# Patient Record
Sex: Male | Born: 1945 | Race: White | Hispanic: No | Marital: Single | Smoking: Former smoker
Health system: Southern US, Community
[De-identification: ages and names within clinical notes are randomized; demographics above are authoritative.]

## PROBLEM LIST (undated history)

## (undated) DIAGNOSIS — G4730 Sleep apnea, unspecified: Secondary | ICD-10-CM

## (undated) DIAGNOSIS — E119 Type 2 diabetes mellitus without complications: Secondary | ICD-10-CM

## (undated) DIAGNOSIS — J449 Chronic obstructive pulmonary disease, unspecified: Secondary | ICD-10-CM

## (undated) DIAGNOSIS — N200 Calculus of kidney: Secondary | ICD-10-CM

---

## 2018-03-10 ENCOUNTER — Ambulatory Visit: Payer: Self-pay | Admitting: Physician Assistant

## 2018-08-23 ENCOUNTER — Ambulatory Visit: Payer: Medicare Other | Admitting: Family Medicine

## 2020-02-01 DIAGNOSIS — Z23 Encounter for immunization: Secondary | ICD-10-CM | POA: Diagnosis not present

## 2020-04-08 DIAGNOSIS — N184 Chronic kidney disease, stage 4 (severe): Secondary | ICD-10-CM | POA: Diagnosis not present

## 2020-04-08 DIAGNOSIS — N1832 Chronic kidney disease, stage 3b: Secondary | ICD-10-CM | POA: Diagnosis not present

## 2020-09-30 ENCOUNTER — Emergency Department: Payer: No Typology Code available for payment source

## 2020-09-30 ENCOUNTER — Inpatient Hospital Stay
Admission: EM | Admit: 2020-09-30 | Discharge: 2020-09-30 | DRG: 293 | Disposition: A | Discharge status: Home / Self Care | Payer: No Typology Code available for payment source | Attending: Internal Medicine | Admitting: Internal Medicine

## 2020-09-30 ENCOUNTER — Other Ambulatory Visit: Payer: Self-pay

## 2020-09-30 DIAGNOSIS — I4891 Unspecified atrial fibrillation: Secondary | ICD-10-CM | POA: Diagnosis present

## 2020-09-30 DIAGNOSIS — R0902 Hypoxemia: Secondary | ICD-10-CM

## 2020-09-30 DIAGNOSIS — I110 Hypertensive heart disease with heart failure: Secondary | ICD-10-CM | POA: Diagnosis not present

## 2020-09-30 DIAGNOSIS — G4730 Sleep apnea, unspecified: Secondary | ICD-10-CM | POA: Diagnosis present

## 2020-09-30 DIAGNOSIS — Z79899 Other long term (current) drug therapy: Secondary | ICD-10-CM

## 2020-09-30 DIAGNOSIS — I509 Heart failure, unspecified: Secondary | ICD-10-CM | POA: Diagnosis present

## 2020-09-30 DIAGNOSIS — Z20822 Contact with and (suspected) exposure to covid-19: Secondary | ICD-10-CM | POA: Diagnosis present

## 2020-09-30 DIAGNOSIS — J449 Chronic obstructive pulmonary disease, unspecified: Secondary | ICD-10-CM | POA: Diagnosis present

## 2020-09-30 DIAGNOSIS — Z743 Need for continuous supervision: Secondary | ICD-10-CM | POA: Diagnosis not present

## 2020-09-30 DIAGNOSIS — R609 Edema, unspecified: Secondary | ICD-10-CM | POA: Diagnosis not present

## 2020-09-30 DIAGNOSIS — R0602 Shortness of breath: Secondary | ICD-10-CM | POA: Diagnosis not present

## 2020-09-30 DIAGNOSIS — I10 Essential (primary) hypertension: Secondary | ICD-10-CM

## 2020-09-30 DIAGNOSIS — R0689 Other abnormalities of breathing: Secondary | ICD-10-CM | POA: Diagnosis not present

## 2020-09-30 DIAGNOSIS — R6889 Other general symptoms and signs: Secondary | ICD-10-CM | POA: Diagnosis not present

## 2020-09-30 DIAGNOSIS — E119 Type 2 diabetes mellitus without complications: Secondary | ICD-10-CM | POA: Diagnosis present

## 2020-09-30 HISTORY — DX: Chronic obstructive pulmonary disease, unspecified: J44.9

## 2020-09-30 HISTORY — DX: Calculus of kidney: N20.0

## 2020-09-30 HISTORY — DX: Type 2 diabetes mellitus without complications: E11.9

## 2020-09-30 HISTORY — DX: Sleep apnea, unspecified: G47.30

## 2020-09-30 LAB — COMPREHENSIVE METABOLIC PANEL
ALT: 29 U/L (ref 0–44)
AST: 28 U/L (ref 15–41)
Albumin: 3.7 g/dL (ref 3.5–5.0)
Alkaline Phosphatase: 68 U/L (ref 38–126)
Anion gap: 11 (ref 5–15)
BUN: 38 mg/dL — ABNORMAL HIGH (ref 8–23)
CO2: 22 mmol/L (ref 22–32)
Calcium: 9 mg/dL (ref 8.9–10.3)
Chloride: 105 mmol/L (ref 98–111)
Creatinine, Ser: 3.43 mg/dL — ABNORMAL HIGH (ref 0.61–1.24)
GFR, Estimated: 18 mL/min — ABNORMAL LOW (ref >60)
Glucose, Bld: 134 mg/dL — ABNORMAL HIGH (ref 70–99)
Potassium: 3.4 mmol/L — ABNORMAL LOW (ref 3.5–5.1)
Sodium: 138 mmol/L (ref 135–145)
Total Bilirubin: 0.8 mg/dL (ref 0.3–1.2)
Total Protein: 7 g/dL (ref 6.5–8.1)

## 2020-09-30 LAB — CBC WITH DIFFERENTIAL/PLATELET
Abs Immature Granulocytes: 0.04 10*3/uL (ref 0.00–0.07)
Basophils Absolute: 0 10*3/uL (ref 0.0–0.1)
Basophils Relative: 0 %
Eosinophils Absolute: 0.5 10*3/uL (ref 0.0–0.5)
Eosinophils Relative: 6 %
HCT: 35.7 % — ABNORMAL LOW (ref 39.0–52.0)
Hemoglobin: 11.8 g/dL — ABNORMAL LOW (ref 13.0–17.0)
Immature Granulocytes: 1 %
Lymphocytes Relative: 19 %
Lymphs Abs: 1.7 10*3/uL (ref 0.7–4.0)
MCH: 27.8 pg (ref 26.0–34.0)
MCHC: 33.1 g/dL (ref 30.0–36.0)
MCV: 84.2 fL (ref 80.0–100.0)
Monocytes Absolute: 0.6 10*3/uL (ref 0.1–1.0)
Monocytes Relative: 7 %
Neutro Abs: 5.8 10*3/uL (ref 1.7–7.7)
Neutrophils Relative %: 67 %
Platelets: 234 10*3/uL (ref 150–400)
RBC: 4.24 MIL/uL (ref 4.22–5.81)
RDW: 15.1 % (ref 11.5–15.5)
WBC: 8.6 10*3/uL (ref 4.0–10.5)
nRBC: 0 % (ref 0.0–0.2)

## 2020-09-30 LAB — TROPONIN I (HIGH SENSITIVITY)
Troponin I (High Sensitivity): 31 ng/L — ABNORMAL HIGH (ref <18)
Troponin I (High Sensitivity): 50 ng/L — ABNORMAL HIGH (ref <18)

## 2020-09-30 LAB — PROTIME-INR
INR: 1.2 (ref 0.8–1.2)
Prothrombin Time: 14.9 seconds (ref 11.4–15.2)

## 2020-09-30 LAB — RESP PANEL BY RT-PCR (FLU A&B, COVID) ARPGX2
Influenza A by PCR: NEGATIVE
Influenza B by PCR: NEGATIVE
SARS Coronavirus 2 by RT PCR: NEGATIVE

## 2020-09-30 LAB — BRAIN NATRIURETIC PEPTIDE: B Natriuretic Peptide: 1119.7 pg/mL — ABNORMAL HIGH (ref 0.0–100.0)

## 2020-09-30 MED ORDER — ONDANSETRON HCL 4 MG/2ML IJ SOLN
4.0000 mg | Freq: Once | INTRAMUSCULAR | Status: AC
  Administered 2020-09-30: 4 mg via INTRAVENOUS
  Filled 2020-09-30: qty 2

## 2020-09-30 MED ORDER — MOMETASONE FURO-FORMOTEROL FUM 200-5 MCG/ACT IN AERO
2.0000 | INHALATION_SPRAY | Freq: Two times a day (BID) | RESPIRATORY_TRACT | Status: DC
  Administered 2020-09-30: 2 via RESPIRATORY_TRACT
  Filled 2020-09-30: qty 8.8

## 2020-09-30 MED ORDER — IRBESARTAN 150 MG PO TABS
300.0000 mg | ORAL_TABLET | Freq: Every day | ORAL | Status: DC
  Administered 2020-09-30: 300 mg via ORAL
  Filled 2020-09-30: qty 2

## 2020-09-30 MED ORDER — LORATADINE 10 MG PO TABS: 10.0000 mg | ORAL_TABLET | Freq: Every day | ORAL | Status: DC

## 2020-09-30 MED ORDER — ALLOPURINOL 100 MG PO TABS
100.0000 mg | ORAL_TABLET | Freq: Every day | ORAL | Status: DC
  Administered 2020-09-30: 100 mg via ORAL
  Filled 2020-09-30: qty 1

## 2020-09-30 MED ORDER — SODIUM CHLORIDE 0.9% FLUSH
3.0000 mL | Freq: Two times a day (BID) | INTRAVENOUS | Status: DC
  Administered 2020-09-30: 3 mL via INTRAVENOUS

## 2020-09-30 MED ORDER — TERBINAFINE HCL 1 % EX CREA: 1.0000 "application " | TOPICAL_CREAM | Freq: Two times a day (BID) | CUTANEOUS | Status: DC

## 2020-09-30 MED ORDER — SODIUM CHLORIDE 0.9% FLUSH: 3.0000 mL | INTRAVENOUS | Status: DC | PRN

## 2020-09-30 MED ORDER — FLUTICASONE PROPIONATE 50 MCG/ACT NA SUSP
1.0000 | Freq: Every day | NASAL | Status: DC
  Filled 2020-09-30: qty 16

## 2020-09-30 MED ORDER — TRETINOIN 0.025 % EX CREA: TOPICAL_CREAM | Freq: Every day | CUTANEOUS | Status: DC

## 2020-09-30 MED ORDER — ACETAMINOPHEN 500 MG PO TABS
ORAL_TABLET | ORAL | Status: AC
  Filled 2020-09-30: qty 1

## 2020-09-30 MED ORDER — ONDANSETRON HCL 4 MG/2ML IJ SOLN: 4.0000 mg | Freq: Four times a day (QID) | INTRAMUSCULAR | Status: DC | PRN

## 2020-09-30 MED ORDER — CALCITRIOL 0.25 MCG PO CAPS
0.2500 ug | ORAL_CAPSULE | Freq: Every day | ORAL | Status: DC
  Administered 2020-09-30: 0.25 ug via ORAL
  Filled 2020-09-30: qty 1

## 2020-09-30 MED ORDER — NITROGLYCERIN 2 % TD OINT
1.0000 [in_us] | TOPICAL_OINTMENT | Freq: Once | TRANSDERMAL | Status: AC
  Administered 2020-09-30: 1 [in_us] via TOPICAL
  Filled 2020-09-30: qty 1

## 2020-09-30 MED ORDER — POLYVINYL ALCOHOL 1.4 % OP SOLN
1.0000 [drp] | Freq: Three times a day (TID) | OPHTHALMIC | Status: DC | PRN
  Filled 2020-09-30: qty 15

## 2020-09-30 MED ORDER — TRIAMCINOLONE ACETONIDE 0.1 % EX CREA: 1.0000 "application " | TOPICAL_CREAM | Freq: Two times a day (BID) | CUTANEOUS | Status: DC

## 2020-09-30 MED ORDER — SPIRONOLACTONE 25 MG PO TABS
25.0000 mg | ORAL_TABLET | Freq: Every day | ORAL | Status: DC
  Administered 2020-09-30: 25 mg via ORAL
  Filled 2020-09-30: qty 1

## 2020-09-30 MED ORDER — FUROSEMIDE 10 MG/ML IJ SOLN: 60.0000 mg | Freq: Two times a day (BID) | INTRAMUSCULAR | Status: DC

## 2020-09-30 MED ORDER — ACETAMINOPHEN 325 MG PO TABS: 650.0000 mg | ORAL_TABLET | ORAL | Status: DC | PRN

## 2020-09-30 MED ORDER — HYDRALAZINE HCL 50 MG PO TABS
50.0000 mg | ORAL_TABLET | Freq: Three times a day (TID) | ORAL | Status: DC
  Administered 2020-09-30: 50 mg via ORAL
  Filled 2020-09-30: qty 1

## 2020-09-30 MED ORDER — FUROSEMIDE 10 MG/ML IJ SOLN
40.0000 mg | Freq: Once | INTRAMUSCULAR | Status: AC
  Administered 2020-09-30: 40 mg via INTRAVENOUS
  Filled 2020-09-30: qty 4

## 2020-09-30 MED ORDER — MELATONIN 5 MG PO TABS: 5.0000 mg | ORAL_TABLET | Freq: Every day | ORAL | Status: DC

## 2020-09-30 MED ORDER — DARIFENACIN HYDROBROMIDE ER 15 MG PO TB24: 15.0000 mg | ORAL_TABLET | Freq: Every day | ORAL | Status: DC

## 2020-09-30 MED ORDER — SODIUM CHLORIDE 0.9 % IV SOLN: 250.0000 mL | INTRAVENOUS | Status: DC | PRN

## 2020-09-30 MED ORDER — ENOXAPARIN SODIUM 30 MG/0.3ML IJ SOSY
30.0000 mg | PREFILLED_SYRINGE | INTRAMUSCULAR | Status: DC
  Filled 2020-09-30: qty 0.3

## 2020-09-30 MED ORDER — ACETAMINOPHEN 500 MG PO TABS
1000.0000 mg | ORAL_TABLET | Freq: Once | ORAL | Status: AC
  Administered 2020-09-30: 1000 mg via ORAL

## 2020-09-30 MED ORDER — CARVEDILOL 6.25 MG PO TABS: 12.5000 mg | ORAL_TABLET | Freq: Two times a day (BID) | ORAL | Status: DC

## 2020-09-30 MED ORDER — POTASSIUM CHLORIDE CRYS ER 20 MEQ PO TBCR
20.0000 meq | EXTENDED_RELEASE_TABLET | Freq: Two times a day (BID) | ORAL | Status: DC
  Administered 2020-09-30: 20 meq via ORAL

## 2020-09-30 NOTE — ED Notes (Signed)
Report given to Katie RN.

## 2020-09-30 NOTE — ED Provider Notes (Addendum)
Procedures  Clinical Course as of 09/30/20 1506  Mon Sep 30, 2020  [id number] Lab work noted; [place] transfer forms filled out and will be faxed.  Care will be transferred to Dr. Joni Fears at change of shift pending acceptance to the [place].  [facility] will [facility] patient to our facility for CHF exacerbation with hypoxia. [JS]  [id number] [facility] Kenton Kingfisher who notes that they lack any bed availability for telemetry or stepdown at this time, they are unable to accept and they declined the transfer.  They recommend local admission at [facility] for further management.  I will discuss with hospitalist. [PS]    Clinical Course User Index [JS] Paulette Blanch, MD [PS] Carrie Mew, MD   Patient now declines to be admitted.  Refused hospitalization stating that he feels better.  He is lying supine on the bed, oxygen saturation 96% on room air.  Denies orthopnea.  He has had good urine output here in the ED.  He notes that he has an appointment with his [place] cardiologist in 3 days on Thursday.  Will have the patient ambulate in the room to ensure that he does not become severely symptomatic.  I will have him take an increased dose of his morning Lasix for the next 2 days, and continue his usual dose of afternoon Lasix.  We will provide information for [facility] heart failure clinic as needed.  Advised to return if symptoms worsen.  Will discharge Erik Clark.  Final diagnoses:  Acute on chronic congestive heart failure, unspecified heart failure type ([place])  Hypoxia  Hypertension, unspecified type  Atrial fibrillation, unspecified type [facility])      Carrie Mew, MD 09/30/20 1417    Carrie Mew, MD 09/30/20 1506

## 2020-09-30 NOTE — ED Notes (Signed)
Pt SpO2 89% on RA. Pt placed on 2L [place]

## 2020-09-30 NOTE — Progress Notes (Signed)
Patient declines admission at this time.  Notified Dr Joni Fears

## 2020-09-30 NOTE — Discharge Instructions (Signed)
For the next two days, increase your morning dose of Lasix to '80mg'$ .  Continue your '40mg'$  lasix dose for the afternoon dose.  After 2 days, resume taking '40mg'$  lasix twice a day.

## 2020-09-30 NOTE — ED Notes (Signed)
Faxed Paperwork to [place] in [place]

## 2020-09-30 NOTE — ED Provider Notes (Addendum)
[facility] Emergency Department Provider Note   ____________________________________________   Event Date/Time   First MD Initiated Contact with Patient 09/30/20 0532     (approximate)  I have reviewed the triage vital signs and the nursing notes.   HISTORY  Chief Complaint Shortness of Breath (Pt presenting from home via EMS. Reports SOB that started yesterday and worsened this AM while pt was lying down trying to go to bed. Denies chest pain. Reports feeling palpitations. BP medications recently changed, but reports taking meds as prescribed. Received 1 duoneb en route that he states helped some )    HPI Erik Clark is a 75 y.o. male brought to the ED via EMS from home with a chief complaint of shortness of breath.  Patient reports shortness of breath while trying to lay down to go to sleep.  Admits to eating a bag of potato chips last evening.  History of CHF not on home oxygen, PAF on Eliquis, COPD.  Received DuoNeb by EMS prior to arrival with some improvement in symptoms.  Denies fever, cough, chest pain, abdominal pain, nausea, vomiting or dizziness.     Past Medical History:  Diagnosis Date  . COPD (chronic obstructive pulmonary disease) ([place])   . Diabetes mellitus without complication ([place])   . Kidney stone   . Sleep apnea     There are no problems to display for this patient.   History reviewed. No pertinent surgical history.  Prior to Admission medications   Medication Sig Start Date End Date Taking? Authorizing Provider  allopurinol (ZYLOPRIM) 100 MG tablet Take 100 mg by mouth daily.   Yes [provider]  calcitRIOL (ROCALTROL) 0.25 MCG capsule Take 0.25 mcg by mouth daily.   Yes [provider]  carboxymethylcellulose (REFRESH PLUS) 0.5 % SOLN 1 drop in the morning, at noon, in the evening, and at bedtime.   Yes [provider]  carvedilol (COREG) 12.5 MG tablet Take 12.5 mg by mouth 2 (two) times  daily with a meal.   Yes [provider]  desonide (DESOWEN) 0.05 % cream Apply topically 2 (two) times daily. Apply to itchy areas of face and chest   Yes [provider]  fluocinonide (LIDEX) 0.05 % external solution Apply 1 application topically 2 (two) times daily. Apply to scalp   Yes [provider]  fluticasone (FLONASE) 50 MCG/ACT nasal spray Place 1 spray into both nostrils daily.   Yes [provider]  fluticasone-salmeterol (ADVAIR) 250-50 MCG/ACT AEPB Inhale 1 puff into the lungs in the morning and at bedtime.   Yes [provider]  furosemide (LASIX) 40 MG tablet Take 40 mg by mouth 2 (two) times daily.   Yes [provider]  hydrALAZINE (APRESOLINE) 50 MG tablet Take 50 mg by mouth 3 (three) times daily.   Yes [provider]  ketoconazole (NIZORAL) 2 % cream Apply 1 application topically 2 (two) times daily. Apply to chest area   Yes [provider]  ketoconazole (NIZORAL) 2 % shampoo Apply 1 application topically 2 (two) times a week.   Yes [provider]  ketotifen (ZADITOR) 0.025 % ophthalmic solution Place 1 drop into both eyes 2 (two) times daily.   Yes [provider]  loratadine (CLARITIN) 10 MG tablet Take 10 mg by mouth daily.   Yes [provider]  potassium chloride SA (KLOR-CON) 20 MEQ tablet Take 20 mEq by mouth 2 (two) times daily.   Yes [provider]  sildenafil (VIAGRA) 100 MG tablet Take 100 mg by mouth daily as needed for erectile dysfunction.   Yes [provider]  solifenacin (VESICARE) 10 MG tablet Take 10 mg by mouth daily.   Yes [provider]  terbinafine (LAMISIL) 1 % cream Apply 1 application topically 2 (two) times daily.   Yes [provider]  tretinoin (RETIN-A) 0.025 % cream Apply topically at bedtime.   Yes [provider]  triamcinolone cream (KENALOG) 0.1 % Apply 1 application topically 2 (two) times daily.    Yes [provider]  valsartan (DIOVAN) 320 MG tablet Take 320 mg by mouth daily.   Yes [provider]    Allergies Latex  History reviewed. No pertinent family history.  Social History Social History   Tobacco Use  . Smoking status: Former Research scientist (life sciences)  . Smokeless tobacco: Never Used  Substance Use Topics  . Alcohol use: Not Currently  . Drug use: Never    Review of Systems  Constitutional: No fever/chills Eyes: No visual changes. ENT: No sore throat. Cardiovascular: Denies chest pain. Respiratory: Positive for shortness of breath. Gastrointestinal: No abdominal pain.  No nausea, no vomiting.  No diarrhea.  No constipation. Genitourinary: Negative for dysuria. Musculoskeletal: Negative for back pain. Skin: Negative for rash. Neurological: Negative for headaches, focal weakness or numbness.   ____________________________________________   PHYSICAL EXAM:  VITAL SIGNS: ED Triage Vitals  Enc Vitals Group     BP 09/30/20 0525 (!) 215/113     Pulse Rate 09/30/20 0525 76     Resp 09/30/20 0525 (!) 23     Temp 09/30/20 0531 98 F (36.7 C)     Temp Source 09/30/20 0525 Oral     SpO2 09/30/20 0521 (!) 89 %     Weight 09/30/20 0524 250 lb (113.4 kg)     Height 09/30/20 0524 6' (1.829 m)     Head Circumference --      Peak Flow --      Pain Score 09/30/20 0525 0     Pain Loc --      Pain Edu? --      Excl. in [place]? --     Constitutional: Alert and oriented.  Elderly appearing and in mild acute distress. Eyes: Conjunctivae are normal. PERRL. EOMI. Head: Atraumatic. Nose: No congestion/rhinnorhea. Mouth/Throat: Mucous membranes are moist.   Neck: No stridor.   Cardiovascular: Normal rate, regular rhythm. Grossly normal heart sounds.  Good peripheral circulation. Respiratory: Increased respiratory effort.  No retractions. Lungs with bibasilar rales. Gastrointestinal: Soft and nontender to light or deep palpation. No distention. No abdominal bruits. No  CVA tenderness. Musculoskeletal: No lower extremity tenderness.  1+ BLE nonpitting edema.  No joint effusions. Neurologic:  Normal speech and language. No gross focal neurologic deficits are appreciated. No gait instability. Skin:  Skin is warm, dry and intact. No rash noted. Psychiatric: Mood and affect are normal. Speech and behavior are normal.  ____________________________________________   LABS (all labs ordered are listed, but only abnormal results are displayed)  Labs Reviewed  CBC WITH DIFFERENTIAL/PLATELET - Abnormal; Notable for the following components:      Result Value   Hemoglobin 11.8 (*)    HCT 35.7 (*)    All other components within normal limits  COMPREHENSIVE METABOLIC PANEL - Abnormal; Notable for the following components:   Potassium 3.4 (*)    Glucose, Bld 134 (*)    BUN 38 (*)    Creatinine, Ser 3.43 (*)  GFR, Estimated 18 (*)    All other components within normal limits  BRAIN NATRIURETIC PEPTIDE - Abnormal; Notable for the following components:   B Natriuretic Peptide 1,119.7 (*)    All other components within normal limits  TROPONIN I (HIGH SENSITIVITY) - Abnormal; Notable for the following components:   Troponin I (High Sensitivity) 31 (*)    All other components within normal limits  RESP PANEL BY RT-PCR (FLU A&B, COVID) ARPGX2  PROTIME-INR  TROPONIN I (HIGH SENSITIVITY)   ____________________________________________  EKG  ED ECG REPORT I, Farhaan Mabee J, the attending physician, personally viewed and interpreted this ECG.   Date: 09/30/2020  EKG Time: 0540  Rate: 74  Rhythm: normal EKG, normal sinus rhythm  Axis: Normal  Intervals:nonspecific intraventricular conduction delay  ST&T Change: Nonspecific QTC 522  ____________________________________________  RADIOLOGY I, Breeann Reposa J, personally viewed and evaluated these images (plain radiographs) as part of my medical decision making, as well as reviewing the written report by the  radiologist.  ED MD interpretation: CHF; interstitial chronic changes  Official radiology report(s): DG Chest Port 1 View  Result Date: 09/30/2020 CLINICAL DATA:  Dyspnea EXAM: PORTABLE CHEST 1 VIEW COMPARISON:  None. FINDINGS: The lungs are symmetrically well inflated. There is bilateral mid and lower lung zone interstitial pulmonary infiltrate, likely related to changes of underlying interstitial lung disease. No pneumothorax or pleural effusion. Cardiac size is mildly enlarged. No acute bone abnormality. IMPRESSION: Probable chronic interstitial changes involving the mid and lower lung zone. This could be better assessed with high-resolution CT imaging of the chest, if indicated. Electronically Signed   By: Fidela Salisbury MD   On: 09/30/2020 06:55    ____________________________________________   PROCEDURES  Procedure(s) performed (including Critical Care):  .1-3 Lead EKG Interpretation Performed by: Paulette Blanch, MD Authorized by: Paulette Blanch, MD     Interpretation: normal     ECG rate:  75   ECG rate assessment: normal     Rhythm: sinus rhythm     Ectopy: none     Conduction: normal   Comments:     Patient placed on cardiac monitor to evaluate for arrhythmias    CRITICAL CARE Performed by: Paulette Blanch   Total critical care time: 30 minutes  Critical care time was exclusive of separately billable procedures and treating other patients.  Critical care was necessary to treat or prevent imminent or life-threatening deterioration.  Critical care was time spent personally by me on the following activities: development of treatment plan with patient and/or surrogate as well as nursing, discussions with consultants, evaluation of patient's response to treatment, examination of patient, obtaining history from patient or surrogate, ordering and performing treatments and interventions, ordering and review of laboratory studies, ordering and review of radiographic studies, pulse  oximetry and re-evaluation of patient's condition.  ____________________________________________   INITIAL IMPRESSION / ASSESSMENT AND PLAN / ED COURSE  As part of my medical decision making, I reviewed the following data within the [place] notes reviewed and incorporated, Labs reviewed, EKG interpreted, Old chart reviewed, Radiograph reviewed and Notes from prior ED visits     75 year old male presenting with shortness of breath. Differential includes, but is not limited to, viral syndrome, bronchitis including COPD exacerbation, pneumonia, reactive airway disease including asthma, CHF including exacerbation with or without pulmonary/interstitial edema, pneumothorax, ACS, thoracic trauma, and pulmonary embolism.  Will obtain cardiac work-up, chest x-ray.  Patient hypoxic upon arrival, placed on 2 L nasal cannula oxygen with  improvement in saturation.  We discussed most likely patient will require hospitalization.  He receives his care from the [facility] and would like to be transferred there.  Will administer IV Lasix, nitroglycerin paste for hypertension.  IV Zofran given for nausea.  [facility] start [facility] process once labs and imaging are resulted.  Clinical Course as of 09/30/20 [id number]  Mon Sep 30, 2020  [id number] Lab work noted; [place] transfer forms filled out and will be faxed.  Care will be transferred to Dr. Joni Fears at change of shift pending acceptance to the [place].  If [facility], will admit patient to our facility for CHF exacerbation with hypoxia. [JS]    Clinical Course User Index [JS] Paulette Blanch, MD     ____________________________________________   FINAL CLINICAL IMPRESSION(S) / ED DIAGNOSES  Final diagnoses:  Acute on chronic congestive heart failure, unspecified heart failure type ([place])  Hypoxia  Hypertension, unspecified type     ED Discharge Orders    None      *Please note:  Youssif Hamer was evaluated in Emergency Department  on 09/30/2020 for the symptoms described in the history of present illness. He was evaluated in the context of the global COVID-19 pandemic, which necessitated consideration that the patient might be at risk for infection with the SARS-CoV-2 virus that causes COVID-19. Institutional protocols and algorithms that pertain to the evaluation of patients at risk for COVID-19 are in a state of rapid change based on information released by regulatory bodies including the CDC and federal and state organizations. These policies and algorithms were followed during the patient's care in the ED.  Some ED evaluations and interventions may be delayed as a result of limited staffing during and the pandemic.*   Note:  This document was prepared using Dragon voice recognition software and may include unintentional dictation errors.   Paulette Blanch, MD 09/30/20 [id number]    Paulette Blanch, MD 09/30/20 [phone]

## 2020-09-30 NOTE — ED Notes (Signed)
Increased oxygen to 3L [place]. SpO2 now 94%

## 2020-09-30 NOTE — ED Notes (Signed)
XR at bedside

## 2020-09-30 NOTE — ED Notes (Addendum)
Chief Complaint  Patient presents with  . Shortness of Breath    Pt presenting from home via EMS. Reports SOB that started yesterday and worsened this AM while pt was lying down trying to go to bed. Denies chest pain. Reports feeling palpitations. BP medications recently changed, but reports taking meds as prescribed. Received 1 duoneb en route that he states helped some    Pt reports he took an at home COVID test yesterday that was negative. Pt also dealing with kidney stone. Was told by provider if stone not passed by Tuesday he will need surgery.   Pt gowned, placed on monitor. Hypertensive but other VSS. AAOx4. Awaiting provider eval

## 2020-10-01 ENCOUNTER — Telehealth: Payer: Self-pay | Admitting: Family

## 2020-10-01 NOTE — Telephone Encounter (Signed)
LVM with patient in attempt to schedule a new patient CHF Clinic appointment after receiving a referral from patients ED visit on 5/16.    Devona Holmes, NT

## 2020-10-04 ENCOUNTER — Ambulatory Visit: Payer: No Typology Code available for payment source | Admitting: Family

## 2020-10-04 ENCOUNTER — Telehealth: Payer: Self-pay | Admitting: Family

## 2020-10-04 NOTE — Telephone Encounter (Signed)
Patient did not show for his Heart Failure Clinic appointment on 10/04/20. Will attempt to reschedule.

## 2021-02-18 DIAGNOSIS — Z23 Encounter for immunization: Secondary | ICD-10-CM | POA: Diagnosis not present

## 2021-12-04 DIAGNOSIS — E113393 Type 2 diabetes mellitus with moderate nonproliferative diabetic retinopathy without macular edema, bilateral: Secondary | ICD-10-CM | POA: Diagnosis not present

## 2021-12-19 IMAGING — DX DG CHEST 1V PORT
1 series · 2 of 2 positions shown · non-contrast
Comparison: None.

CLINICAL DATA: Dyspnea

EXAM:
PORTABLE CHEST 1 VIEW

[Series 1: chest ap · 0.14mm/px · 2 of 2 slices shown]
[im 1/2]
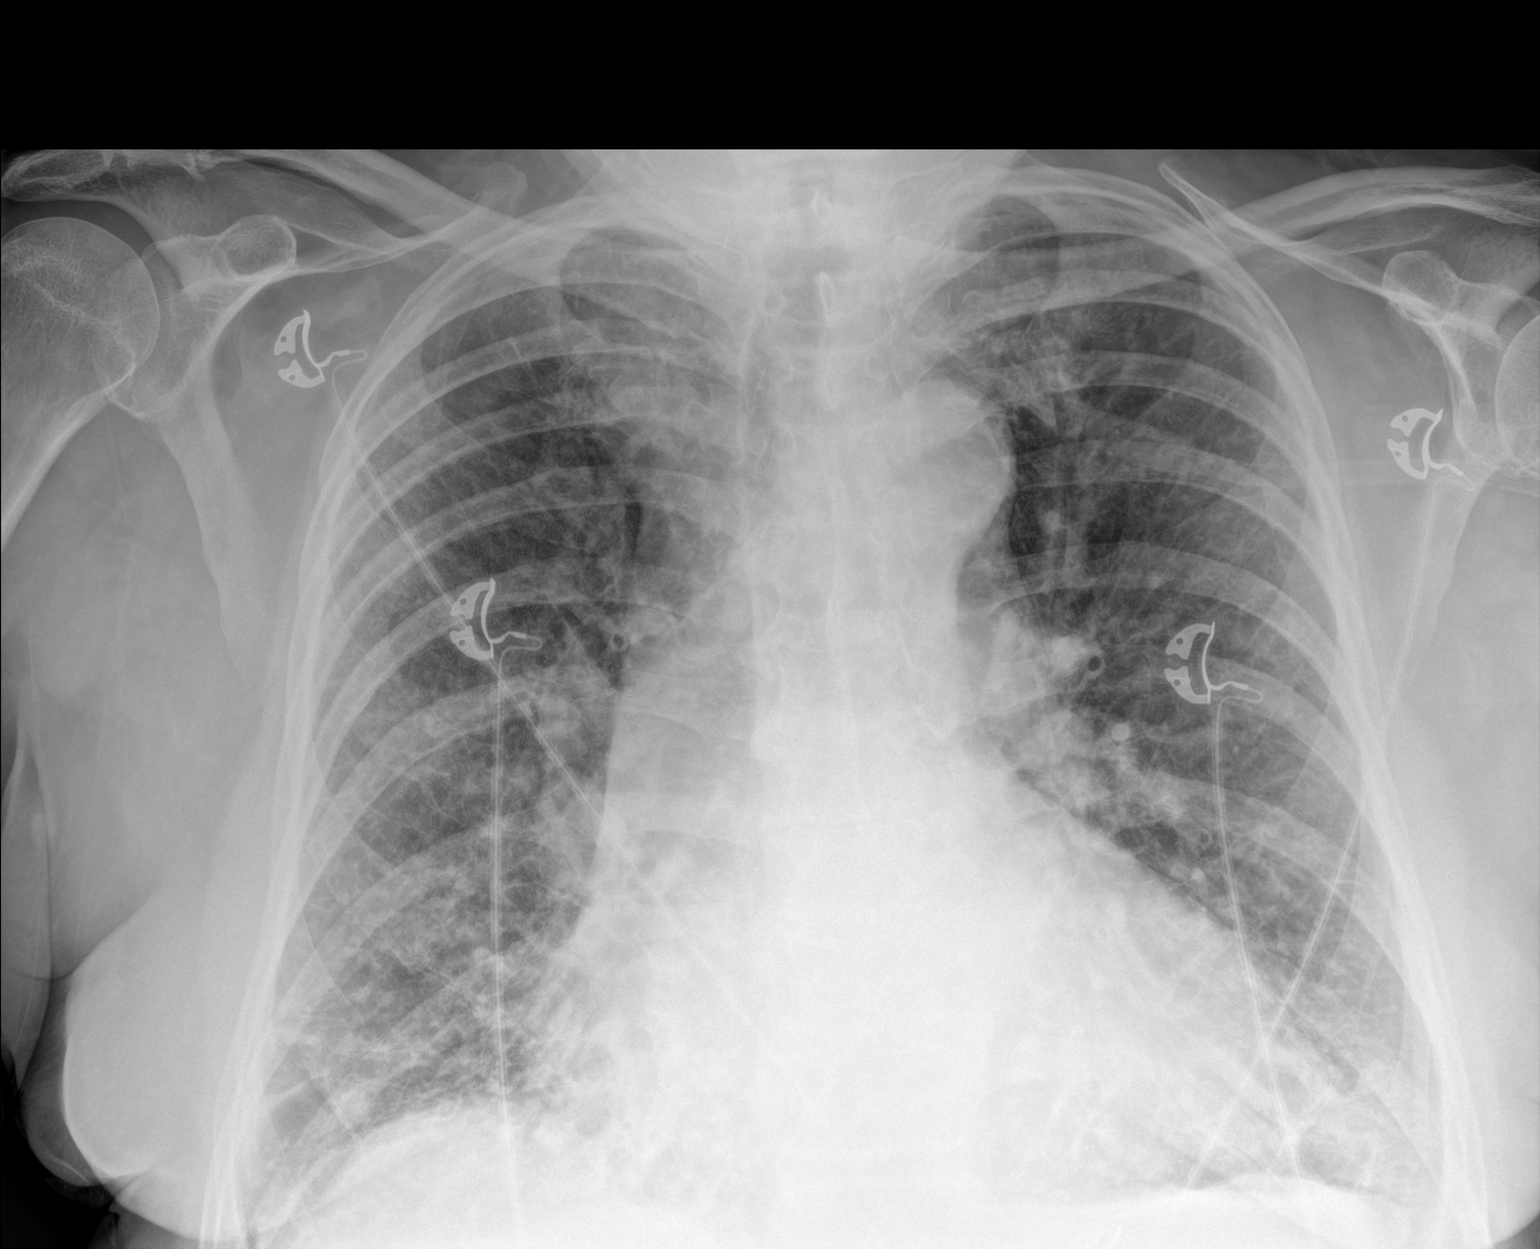
[im 2/2]
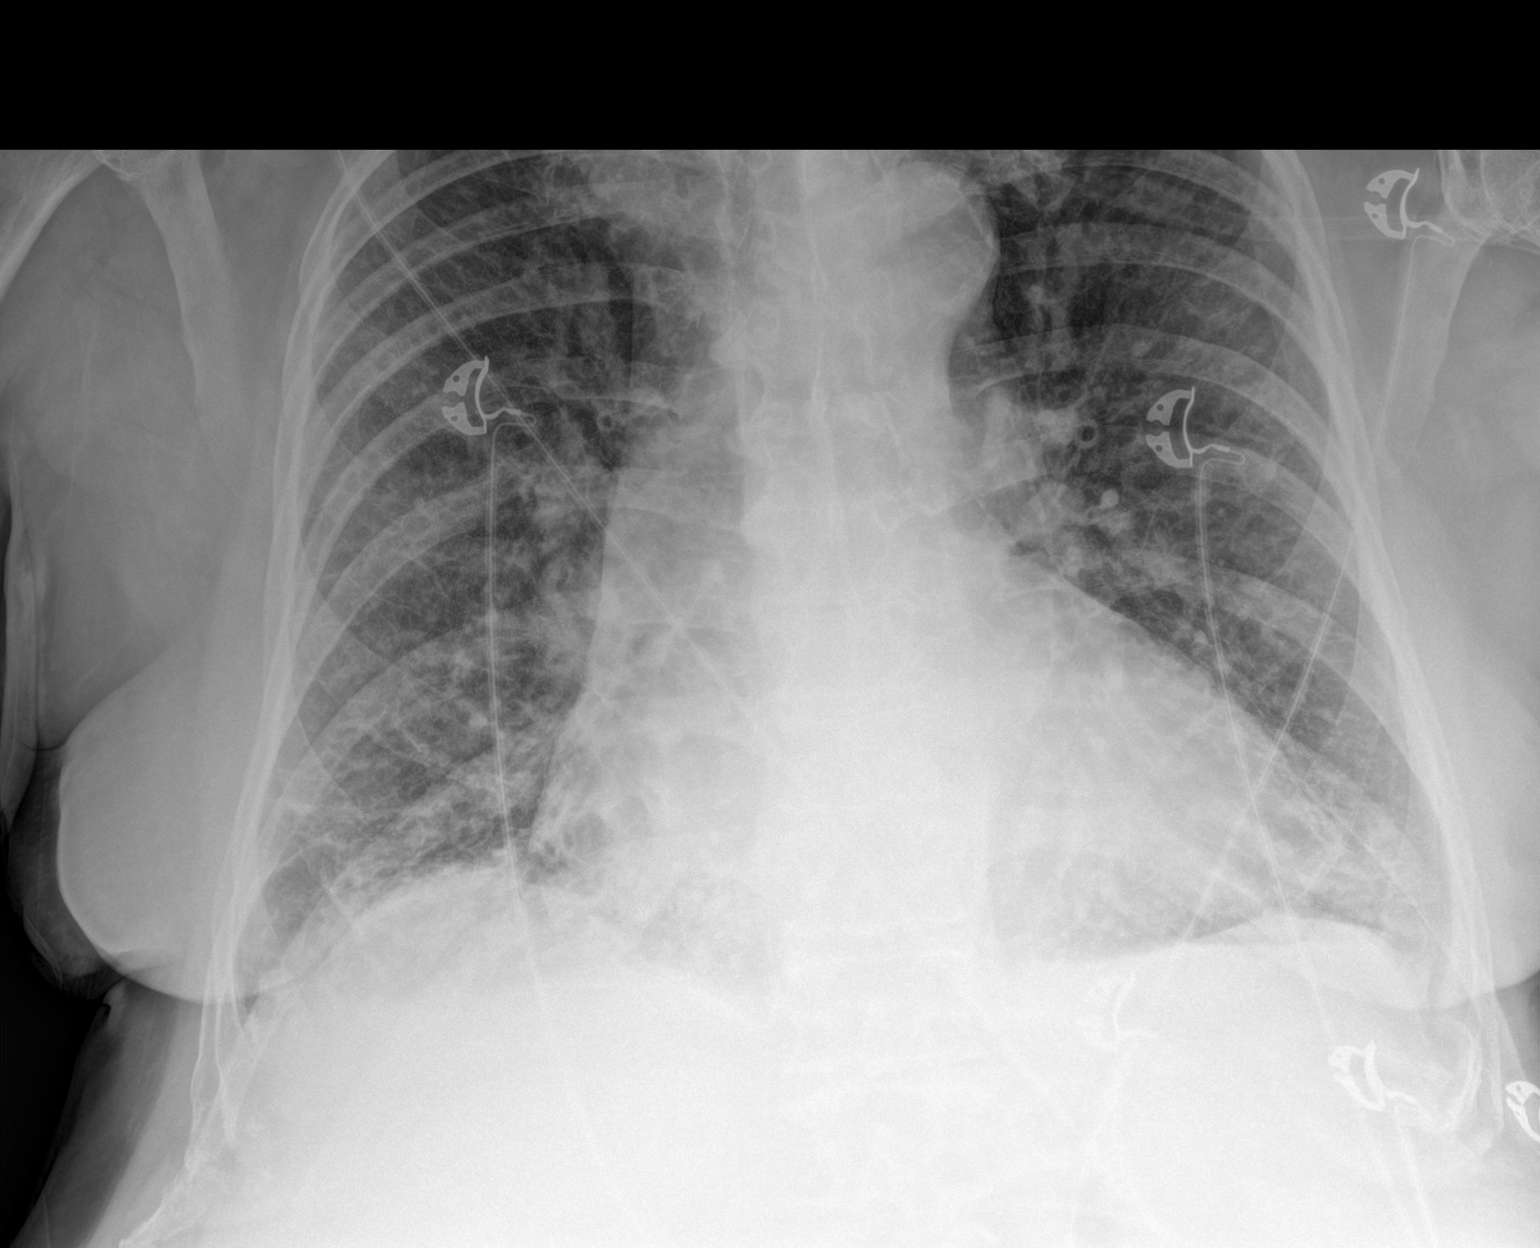

[2 of 2 positions shown; findings below may reference images not displayed]

FINDINGS: The lungs are symmetrically well inflated. There is bilateral mid
and lower lung zone interstitial pulmonary infiltrate, likely
related to changes of underlying interstitial lung disease. No
pneumothorax or pleural effusion. Cardiac size is mildly enlarged.
No acute bone abnormality.
IMPRESSION: Probable chronic interstitial changes involving the mid and lower
lung zone. This could be better assessed with high-resolution CT
imaging of the chest, if indicated.

## 2022-02-04 DIAGNOSIS — Z23 Encounter for immunization: Secondary | ICD-10-CM | POA: Diagnosis not present

## 2023-05-19 DEATH — deceased
